# Patient Record
Sex: Female | Born: 1960 | Race: White | Hispanic: No | Marital: Married | Smoking: Never smoker
Health system: Southern US, Community
[De-identification: ages and names within clinical notes are randomized; demographics above are authoritative.]

---

## 2001-07-14 ENCOUNTER — Encounter: Payer: Self-pay | Admitting: Family Medicine

## 2001-07-14 ENCOUNTER — Encounter: Admission: RE | Admit: 2001-07-14 | Discharge: 2001-07-14 | Payer: Self-pay | Admitting: Family Medicine

## 2008-02-01 ENCOUNTER — Encounter: Admission: RE | Admit: 2008-02-01 | Discharge: 2008-02-01 | Payer: Self-pay | Admitting: Family Medicine

## 2009-03-13 ENCOUNTER — Ambulatory Visit (HOSPITAL_BASED_OUTPATIENT_CLINIC_OR_DEPARTMENT_OTHER): Admission: RE | Admit: 2009-03-13 | Discharge: 2009-03-13 | Payer: Self-pay | Admitting: Family Medicine

## 2009-03-13 ENCOUNTER — Ambulatory Visit: Payer: Self-pay | Admitting: Diagnostic Radiology

## 2010-04-25 ENCOUNTER — Ambulatory Visit: Payer: Self-pay | Admitting: Diagnostic Radiology

## 2010-04-25 ENCOUNTER — Ambulatory Visit (HOSPITAL_BASED_OUTPATIENT_CLINIC_OR_DEPARTMENT_OTHER): Admission: RE | Admit: 2010-04-25 | Discharge: 2010-04-25 | Payer: Self-pay | Admitting: Family Medicine

## 2011-07-03 ENCOUNTER — Other Ambulatory Visit (HOSPITAL_BASED_OUTPATIENT_CLINIC_OR_DEPARTMENT_OTHER): Payer: Self-pay | Admitting: Family Medicine

## 2011-07-03 DIAGNOSIS — Z1231 Encounter for screening mammogram for malignant neoplasm of breast: Secondary | ICD-10-CM

## 2011-07-10 ENCOUNTER — Ambulatory Visit (HOSPITAL_BASED_OUTPATIENT_CLINIC_OR_DEPARTMENT_OTHER)
Admission: RE | Admit: 2011-07-10 | Discharge: 2011-07-10 | Disposition: A | Discharge status: Home / Self Care | Payer: BC Managed Care – PPO | Source: Ambulatory Visit | Attending: Family Medicine | Admitting: Family Medicine

## 2011-07-10 DIAGNOSIS — Z1231 Encounter for screening mammogram for malignant neoplasm of breast: Secondary | ICD-10-CM | POA: Insufficient documentation

## 2012-07-23 ENCOUNTER — Other Ambulatory Visit (HOSPITAL_BASED_OUTPATIENT_CLINIC_OR_DEPARTMENT_OTHER): Payer: Self-pay | Admitting: Family Medicine

## 2012-07-23 DIAGNOSIS — Z1231 Encounter for screening mammogram for malignant neoplasm of breast: Secondary | ICD-10-CM

## 2012-07-26 ENCOUNTER — Ambulatory Visit (HOSPITAL_BASED_OUTPATIENT_CLINIC_OR_DEPARTMENT_OTHER)
Admission: RE | Admit: 2012-07-26 | Discharge: 2012-07-26 | Disposition: A | Discharge status: Home / Self Care | Payer: BC Managed Care – PPO | Source: Ambulatory Visit | Attending: Family Medicine | Admitting: Family Medicine

## 2012-07-26 DIAGNOSIS — Z1231 Encounter for screening mammogram for malignant neoplasm of breast: Secondary | ICD-10-CM | POA: Insufficient documentation

## 2013-11-29 ENCOUNTER — Other Ambulatory Visit (HOSPITAL_BASED_OUTPATIENT_CLINIC_OR_DEPARTMENT_OTHER): Payer: Self-pay | Admitting: Family Medicine

## 2013-11-29 DIAGNOSIS — Z1231 Encounter for screening mammogram for malignant neoplasm of breast: Secondary | ICD-10-CM

## 2013-12-14 ENCOUNTER — Ambulatory Visit (HOSPITAL_BASED_OUTPATIENT_CLINIC_OR_DEPARTMENT_OTHER)
Admission: RE | Admit: 2013-12-14 | Discharge: 2013-12-14 | Disposition: A | Discharge status: Home / Self Care | Payer: BC Managed Care – PPO | Source: Ambulatory Visit | Attending: Family Medicine | Admitting: Family Medicine

## 2013-12-14 DIAGNOSIS — Z1231 Encounter for screening mammogram for malignant neoplasm of breast: Secondary | ICD-10-CM | POA: Insufficient documentation

## 2015-01-09 DIAGNOSIS — M722 Plantar fascial fibromatosis: Secondary | ICD-10-CM | POA: Insufficient documentation

## 2015-01-09 DIAGNOSIS — K219 Gastro-esophageal reflux disease without esophagitis: Secondary | ICD-10-CM | POA: Insufficient documentation

## 2015-01-09 DIAGNOSIS — J302 Other seasonal allergic rhinitis: Secondary | ICD-10-CM | POA: Insufficient documentation

## 2015-02-09 ENCOUNTER — Other Ambulatory Visit (HOSPITAL_BASED_OUTPATIENT_CLINIC_OR_DEPARTMENT_OTHER): Payer: Self-pay | Admitting: Family Medicine

## 2015-02-09 DIAGNOSIS — Z1231 Encounter for screening mammogram for malignant neoplasm of breast: Secondary | ICD-10-CM

## 2015-02-23 ENCOUNTER — Ambulatory Visit (HOSPITAL_BASED_OUTPATIENT_CLINIC_OR_DEPARTMENT_OTHER)
Admission: RE | Admit: 2015-02-23 | Discharge: 2015-02-23 | Disposition: A | Discharge status: Home / Self Care | Payer: BLUE CROSS/BLUE SHIELD | Source: Ambulatory Visit | Attending: Family Medicine | Admitting: Family Medicine

## 2015-02-23 DIAGNOSIS — Z1231 Encounter for screening mammogram for malignant neoplasm of breast: Secondary | ICD-10-CM | POA: Diagnosis present

## 2015-06-19 ENCOUNTER — Encounter: Payer: Self-pay | Admitting: Family Medicine

## 2015-06-19 ENCOUNTER — Ambulatory Visit (INDEPENDENT_AMBULATORY_CARE_PROVIDER_SITE_OTHER): Payer: BLUE CROSS/BLUE SHIELD | Admitting: Family Medicine

## 2015-06-19 ENCOUNTER — Encounter (INDEPENDENT_AMBULATORY_CARE_PROVIDER_SITE_OTHER): Payer: Self-pay

## 2015-06-19 VITALS — BP 118/74 | HR 55 | Ht 62.0 in | Wt 130.0 lb

## 2015-06-19 DIAGNOSIS — M722 Plantar fascial fibromatosis: Secondary | ICD-10-CM | POA: Diagnosis not present

## 2015-06-19 DIAGNOSIS — M2011 Hallux valgus (acquired), right foot: Secondary | ICD-10-CM

## 2015-06-19 DIAGNOSIS — M21611 Bunion of right foot: Secondary | ICD-10-CM

## 2015-06-19 NOTE — Patient Instructions (Signed)
You have plantar fasciitis Take tylenol or aleve as needed for pain  Plantar fascia stretch for 20-30 seconds (do 3 of these) in morning Lowering/raise on a step exercises 3 x 10 once or twice a day - this is very important for long term recovery. Can add heel walks, toe walks forward and backward as well Ice heel for 15 minutes as needed. Avoid flat shoes/barefoot walking as much as possible. Arch straps have been shown to help with pain. Consider our sports insoles or Dr. Jari [employer]man active series insoles in your running shoes. Steroid injection is a consideration for short term pain relief if you are struggling. Bunion pads and toe spacers as often as possible. Follow up with me in 1 month.

## 2015-06-20 DIAGNOSIS — M21619 Bunion of unspecified foot: Secondary | ICD-10-CM | POA: Insufficient documentation

## 2015-06-20 NOTE — Assessment & Plan Note (Signed)
and right bunion - shown home exercises/stretches to do daily.  Icing, tylenol/aleve, arch binders, OTC orthotics.  Given toe spacers and bunion pads.  Consider injection if not improving.  F/u in 1 month.  Activities as tolerated.

## 2015-06-20 NOTE — Progress Notes (Signed)
PCP: Angelica Chessman., MD  Subjective:   HPI: Patient is a 54 y.o. female here for bilateral heel pain.  Patient reports for several months she's had bilateral heel pain left worse than right. Worse in the morning. Better with activity. Also with bunion on right foot that is painful. Training for half marathon in November. Running about 16-20 miles per week currently. Tried HEP, icing. Working with a Chemical engineer. Pain can get up to 8/10 on left, 6/10 on right.  No past medical history on file.  No current outpatient prescriptions on file prior to visit.   No current facility-administered medications on file prior to visit.    No past surgical history on file.  No Known Allergies  History   Social History  . Marital Status: Married    Spouse Name: N/A  . Number of Children: N/A  . Years of Education: N/A   Occupational History  . Not on file.   Social History Main Topics  . Smoking status: Never Smoker   . Smokeless tobacco: Not on file  . Alcohol Use: Not on file  . Drug Use: Not on file  . Sexual Activity: Not on file   Other Topics Concern  . Not on file   Social History Narrative  . No narrative on file    No family history on file.  BP 118/74 mmHg  Pulse 55  Ht  (1.575 m)  Wt 130 lb (58.968 kg)  BMI 23.77 kg/m2  Review of Systems: See HPI above.    Objective:  Physical Exam:  Gen: NAD  Bilateral feet/ankles: Mod hallux valgus/bunion on right, mild on left. Mild overpronation.  No swelling, bruising, other deformity. FROM ankle with 5/5 strength all directions, no pain. TTP plantar fascia insertion proximal calcanei medially as well as right bunion. Negative ant drawer and talar tilt.   Negative syndesmotic compression. Negative metatarsal squeeze. Thompsons test negative. NV intact distally. Midfoot striker.  No gait abnormalities noted. Leg lengths equal. Hip abduction 5/5 bilaterally. No SI joint dysfunction.     Assessment & Plan:  1. Bilateral plantar fasciitis and right bunion - shown home exercises/stretches to do daily.  Icing, tylenol/aleve, arch binders, OTC orthotics.  Given toe spacers and bunion pads.  Consider injection if not improving.  F/u in 1 month.  Activities as tolerated.

## 2015-07-20 ENCOUNTER — Encounter: Payer: Self-pay | Admitting: Family Medicine

## 2015-07-20 ENCOUNTER — Ambulatory Visit (INDEPENDENT_AMBULATORY_CARE_PROVIDER_SITE_OTHER): Payer: BLUE CROSS/BLUE SHIELD | Admitting: Family Medicine

## 2015-07-20 VITALS — BP 102/67 | HR 59 | Ht 62.0 in | Wt 127.0 lb

## 2015-07-20 DIAGNOSIS — M722 Plantar fascial fibromatosis: Secondary | ICD-10-CM | POA: Diagnosis not present

## 2015-07-24 NOTE — Progress Notes (Signed)
PCP: Angelica Chessman., MD  Subjective:   HPI: Patient is a 54 y.o. female here for bilateral heel pain.  8/2: Patient reports for several months she's had bilateral heel pain left worse than right. Worse in the morning. Better with activity. Also with bunion on right foot that is painful. Training for half marathon in November. Running about 16-20 miles per week currently. Tried HEP, icing. Working with a Chemical engineer. Pain can get up to 8/10 on left, 6/10 on right.  9/2: Patient reports she has improved some since last visit. Bunion with minimal pain. Still with L > R plantar fascia pain. Pain level 4/10 currently. Doing home exercises, using arch binders, sports insoles but unable to wear these at work. Now able to run. Considering buying new shoes for work.  No past medical history on file.  Current Outpatient Prescriptions on File Prior to Visit  Medication Sig Dispense Refill  . cetirizine (ZYRTEC) 10 MG tablet Take 10 mg by mouth.    . esomeprazole (NEXIUM) 40 MG capsule Take 40 mg by mouth.    . fluticasone (FLONASE) 50 MCG/ACT nasal spray 1 spray by Each Nare route daily.    . montelukast (SINGULAIR) 10 MG tablet   5   No current facility-administered medications on file prior to visit.    No past surgical history on file.  No Known Allergies  Social History   Social History  . Marital Status: Married    Spouse Name: N/A  . Number of Children: N/A  . Years of Education: N/A   Occupational History  . Not on file.   Social History Main Topics  . Smoking status: Never Smoker   . Smokeless tobacco: Not on file  . Alcohol Use: Not on file  . Drug Use: Not on file  . Sexual Activity: Not on file   Other Topics Concern  . Not on file   Social History Narrative    No family history on file.  BP 102/67 mmHg  Pulse 59  Ht  (1.575 m)  Wt 127 lb (57.607 kg)  BMI 23.22 kg/m2  Review of Systems: See HPI above.    Objective:  Physical  Exam:  Gen: NAD  Bilateral feet/ankles: Mod hallux valgus/bunion on right, mild on left. Mild overpronation.  No swelling, bruising, other deformity. FROM ankle with 5/5 strength all directions, no pain. TTP plantar fascia insertion proximal calcanei medially.  No bunion tenderness. Negative ant drawer and talar tilt.   Negative metatarsal squeeze.    Assessment & Plan:  1. Bilateral plantar fasciitis and right bunion - Improving.  She would like to continue current management but will also seek out better work shoes.  Icing, tylenol/aleve, arch binders, OTC orthotics, toe spacers and bunion pads, HEP.  Consider injection if not improving.  F/u in 6 weeks.

## 2015-07-24 NOTE — Assessment & Plan Note (Signed)
Bilateral plantar fasciitis and right bunion - Improving.  She would like to continue current management but will also seek out better work shoes.  Icing, tylenol/aleve, arch binders, OTC orthotics, toe spacers and bunion pads, HEP.  Consider injection if not improving.  F/u in 6 weeks.

## 2015-07-26 ENCOUNTER — Ambulatory Visit: Payer: BLUE CROSS/BLUE SHIELD | Admitting: Sports Medicine

## 2016-03-18 ENCOUNTER — Other Ambulatory Visit (HOSPITAL_BASED_OUTPATIENT_CLINIC_OR_DEPARTMENT_OTHER): Payer: Self-pay | Admitting: Family Medicine

## 2016-03-18 DIAGNOSIS — Z1231 Encounter for screening mammogram for malignant neoplasm of breast: Secondary | ICD-10-CM

## 2016-04-17 ENCOUNTER — Ambulatory Visit (HOSPITAL_BASED_OUTPATIENT_CLINIC_OR_DEPARTMENT_OTHER)
Admission: RE | Admit: 2016-04-17 | Discharge: 2016-04-17 | Disposition: A | Discharge status: Home / Self Care | Payer: BLUE CROSS/BLUE SHIELD | Source: Ambulatory Visit | Attending: Family Medicine | Admitting: Family Medicine

## 2016-04-17 DIAGNOSIS — Z1231 Encounter for screening mammogram for malignant neoplasm of breast: Secondary | ICD-10-CM | POA: Insufficient documentation

## 2019-05-24 ENCOUNTER — Other Ambulatory Visit (HOSPITAL_BASED_OUTPATIENT_CLINIC_OR_DEPARTMENT_OTHER): Payer: Self-pay | Admitting: Family Medicine

## 2019-05-24 DIAGNOSIS — Z1231 Encounter for screening mammogram for malignant neoplasm of breast: Secondary | ICD-10-CM

## 2019-05-31 ENCOUNTER — Ambulatory Visit (HOSPITAL_BASED_OUTPATIENT_CLINIC_OR_DEPARTMENT_OTHER)
Admission: RE | Admit: 2019-05-31 | Discharge: 2019-05-31 | Disposition: A | Discharge status: Home / Self Care | Payer: BC Managed Care – PPO | Source: Ambulatory Visit | Attending: Family Medicine | Admitting: Family Medicine

## 2019-05-31 ENCOUNTER — Encounter (HOSPITAL_BASED_OUTPATIENT_CLINIC_OR_DEPARTMENT_OTHER): Payer: Self-pay

## 2019-05-31 ENCOUNTER — Other Ambulatory Visit: Payer: Self-pay

## 2019-05-31 DIAGNOSIS — Z1231 Encounter for screening mammogram for malignant neoplasm of breast: Secondary | ICD-10-CM | POA: Insufficient documentation

## 2021-01-08 IMAGING — MG DIGITAL SCREENING BILATERAL MAMMOGRAM WITH TOMO AND CAD
8 series · 8 of 24 positions shown · non-contrast
Comparison: Previous exam(s).

CLINICAL DATA: Screening.

EXAM:
DIGITAL SCREENING BILATERAL MAMMOGRAM WITH TOMO AND CAD

[L MLO synth-2D]
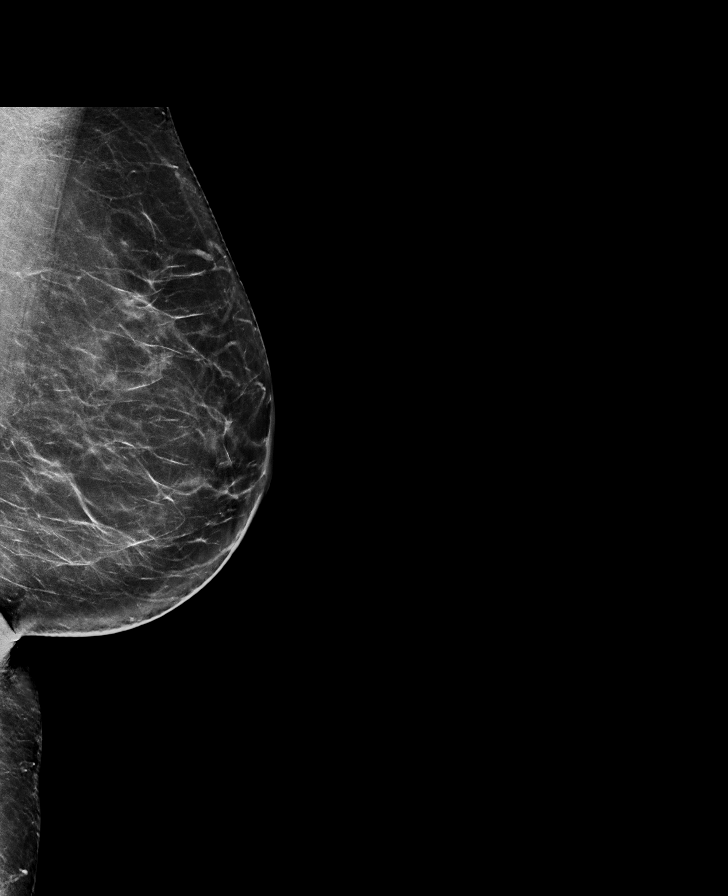

[R CC synth-2D]
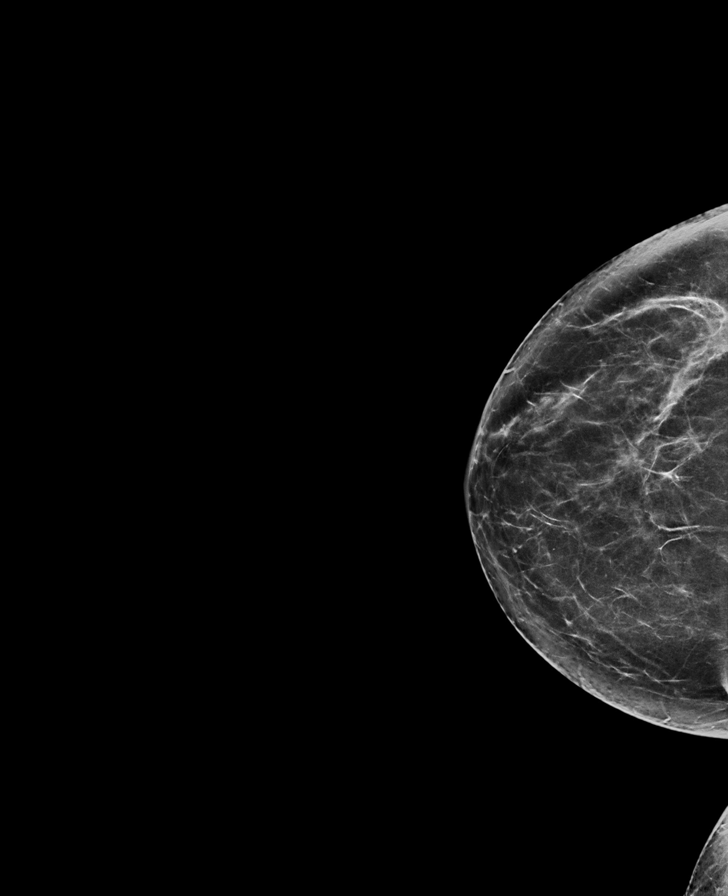

[L CC synth-2D]
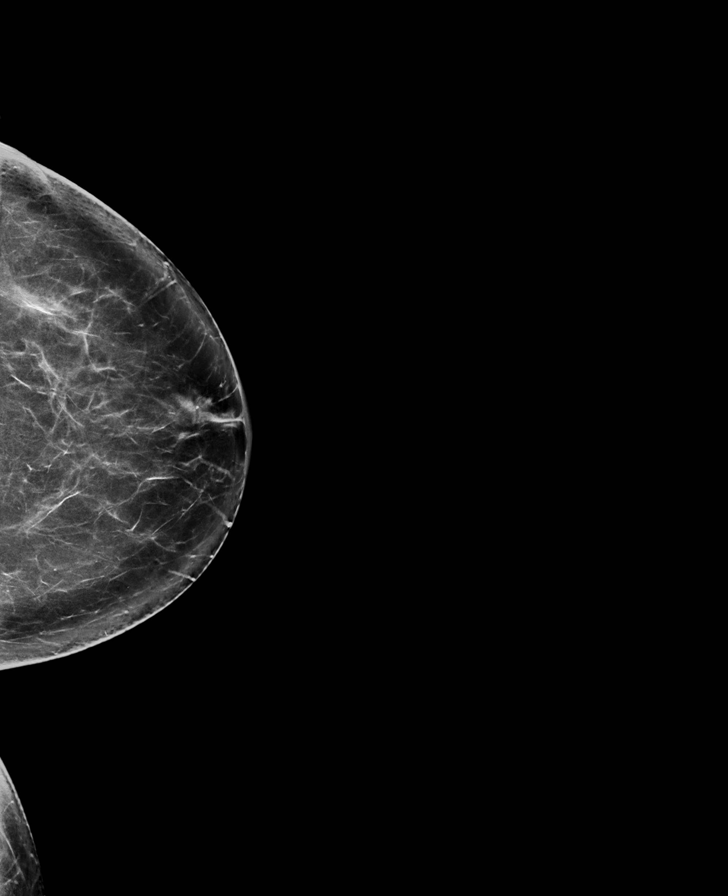

[R MLO synth-2D]
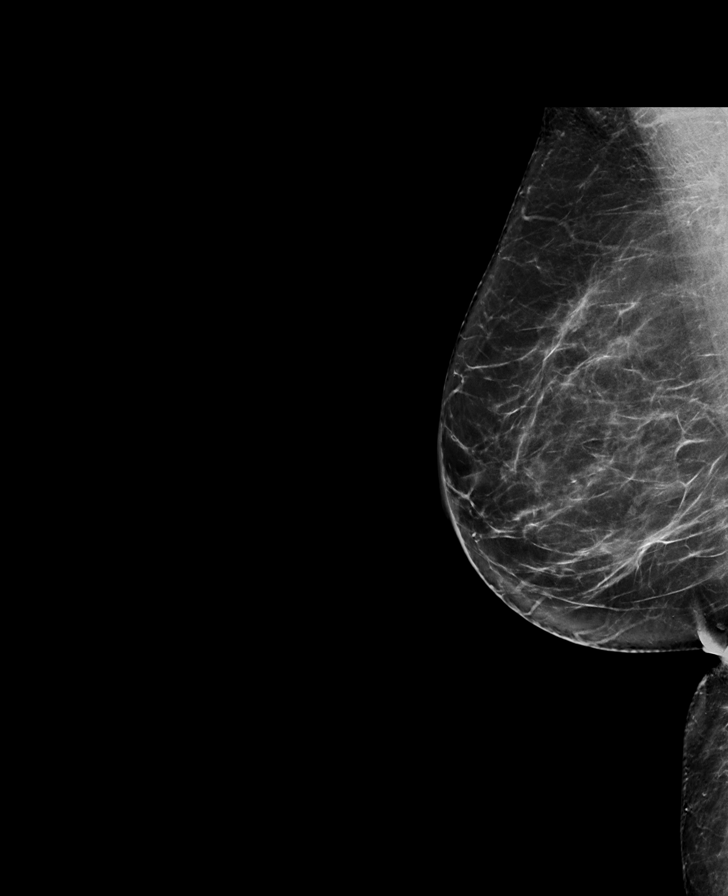

[R MLO tomo · tomo slice 39/78.0]
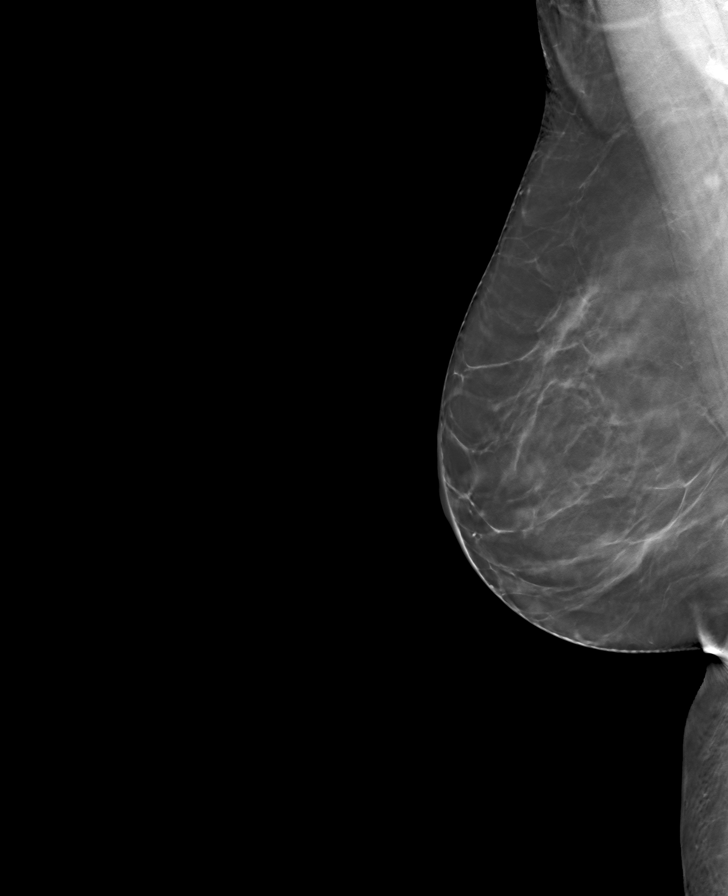

[L CC tomo · tomo slice 40/79.0]
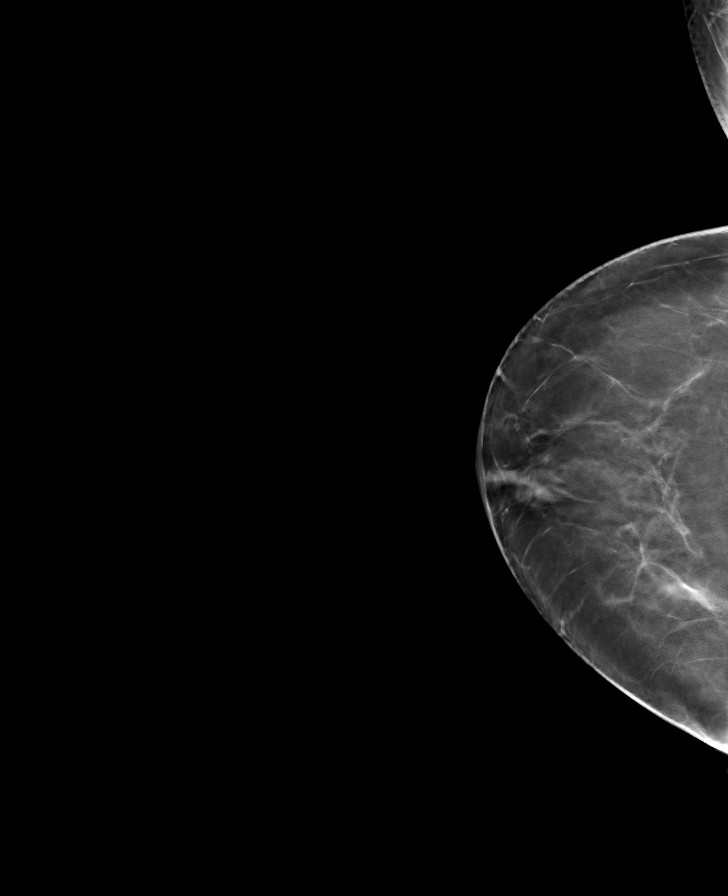

[L MLO tomo · tomo slice 37/73.0]
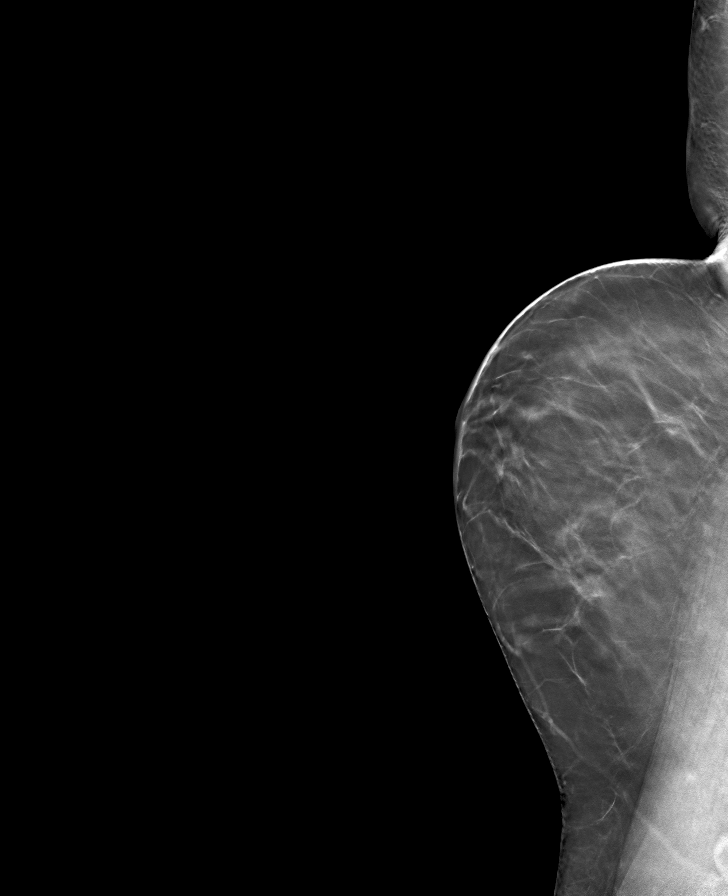

[R CC tomo · tomo slice 37/72.0]
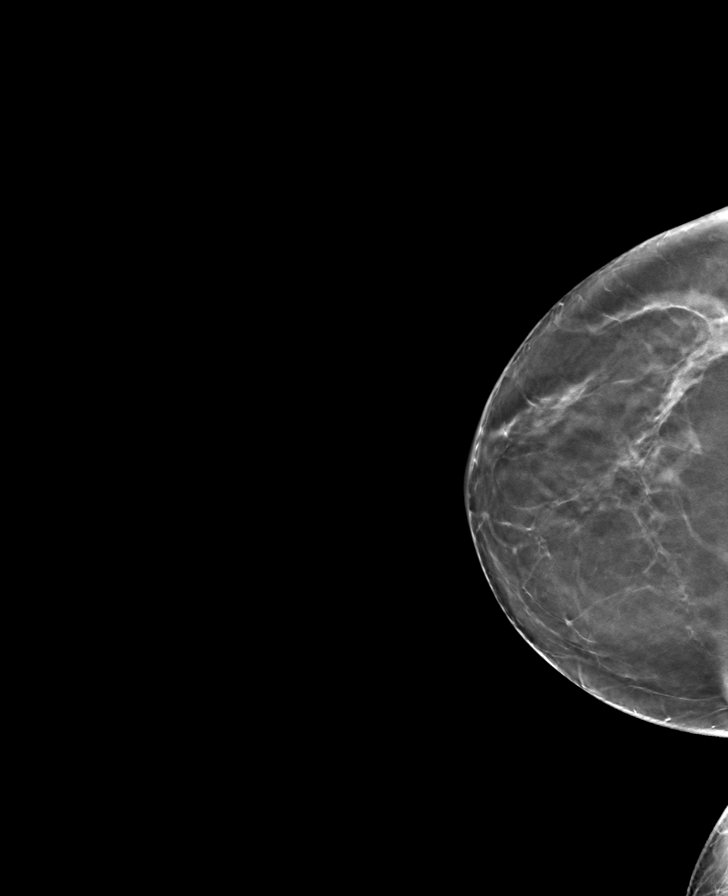

[8 of 24 positions shown; findings below may reference images not displayed]

ACR Breast Density Category b: There are scattered areas of
fibroglandular density.
FINDINGS: There are no findings suspicious for malignancy. Images were
processed with CAD.
IMPRESSION: No mammographic evidence of malignancy. A result letter of this
screening mammogram will be mailed directly to the patient.

RECOMMENDATION:
Screening mammogram in one year. (Code:[id number])

BI-RADS CATEGORY  1: Negative.
